# Patient Record
Sex: Male | Born: 2005 | Race: Black or African American | Hispanic: No | Marital: Single | Smoking: Never smoker
Health system: Southern US, Community
[De-identification: ages and names within clinical notes are randomized; demographics above are authoritative.]

## PROBLEM LIST (undated history)

## (undated) HISTORY — PX: OTHER SURGICAL HISTORY: SHX169

## (undated) HISTORY — PX: TONSILLECTOMY: SUR1361

---
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
## 2024-01-11 ENCOUNTER — Other Ambulatory Visit (HOSPITAL_COMMUNITY): Payer: Self-pay

## 2024-01-13 ENCOUNTER — Other Ambulatory Visit (HOSPITAL_COMMUNITY): Payer: Self-pay

## 2024-01-13 ENCOUNTER — Other Ambulatory Visit: Payer: Self-pay

## 2024-01-13 MED ORDER — LISDEXAMFETAMINE DIMESYLATE 50 MG PO CAPS
50.0000 mg | ORAL_CAPSULE | Freq: Every morning | ORAL | 0 refills | Status: DC
  Filled 2024-01-13 (×2): qty 90, 90d supply, fill #0

## 2024-01-18 ENCOUNTER — Other Ambulatory Visit (HOSPITAL_COMMUNITY): Payer: Self-pay

## 2024-01-18 MED ORDER — LISDEXAMFETAMINE DIMESYLATE 50 MG PO CAPS: 50.0000 mg | ORAL_CAPSULE | Freq: Every morning | ORAL | 0 refills | Status: AC

## 2024-01-19 ENCOUNTER — Other Ambulatory Visit (HOSPITAL_COMMUNITY): Payer: Self-pay
